# Patient Record
Sex: Male | Born: 1995 | Race: Black or African American | Hispanic: No | Marital: Single
Health system: Southern US, Community
[De-identification: ages and names within clinical notes are randomized; demographics above are authoritative.]

## PROBLEM LIST (undated history)

## (undated) VITALS — BP 95/62 | HR 92 | Temp 98.2°F | Resp 18 | Ht <= 58 in | Wt 99.2 lb

## (undated) DIAGNOSIS — K5900 Constipation, unspecified: Secondary | ICD-10-CM

## (undated) DIAGNOSIS — R4689 Other symptoms and signs involving appearance and behavior: Secondary | ICD-10-CM

## (undated) DIAGNOSIS — Z68.41 Body mass index (BMI) pediatric, 85th percentile to less than 95th percentile for age: Secondary | ICD-10-CM

## (undated) DIAGNOSIS — J302 Other seasonal allergic rhinitis: Secondary | ICD-10-CM

## (undated) DIAGNOSIS — F419 Anxiety disorder, unspecified: Secondary | ICD-10-CM

## (undated) DIAGNOSIS — F913 Oppositional defiant disorder: Secondary | ICD-10-CM

## (undated) DIAGNOSIS — E663 Overweight: Secondary | ICD-10-CM

## (undated) DIAGNOSIS — F79 Unspecified intellectual disabilities: Secondary | ICD-10-CM

## (undated) DIAGNOSIS — F909 Attention-deficit hyperactivity disorder, unspecified type: Secondary | ICD-10-CM

## (undated) DIAGNOSIS — F4310 Post-traumatic stress disorder, unspecified: Secondary | ICD-10-CM

## (undated) HISTORY — DX: Body mass index (bmi) pediatric, 85th percentile to less than 95th percentile for age: Z68.53

## (undated) HISTORY — DX: Other seasonal allergic rhinitis: J30.2

## (undated) HISTORY — DX: Unspecified intellectual disabilities: F79

## (undated) HISTORY — DX: Overweight: E66.3

## (undated) HISTORY — PX: TYMPANOSTOMY TUBE PLACEMENT: SHX32

---
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
## 2018-03-27 IMAGING — DX DG HAND COMPLETE 3+V*R*
4 series · 4 of 4 positions shown · non-contrast
Comparison: None.

CLINICAL DATA: Status post fall onto hand, with right hand pain.
Initial encounter.

EXAM:
RIGHT HAND - COMPLETE 3+ VIEW

[hand pa]
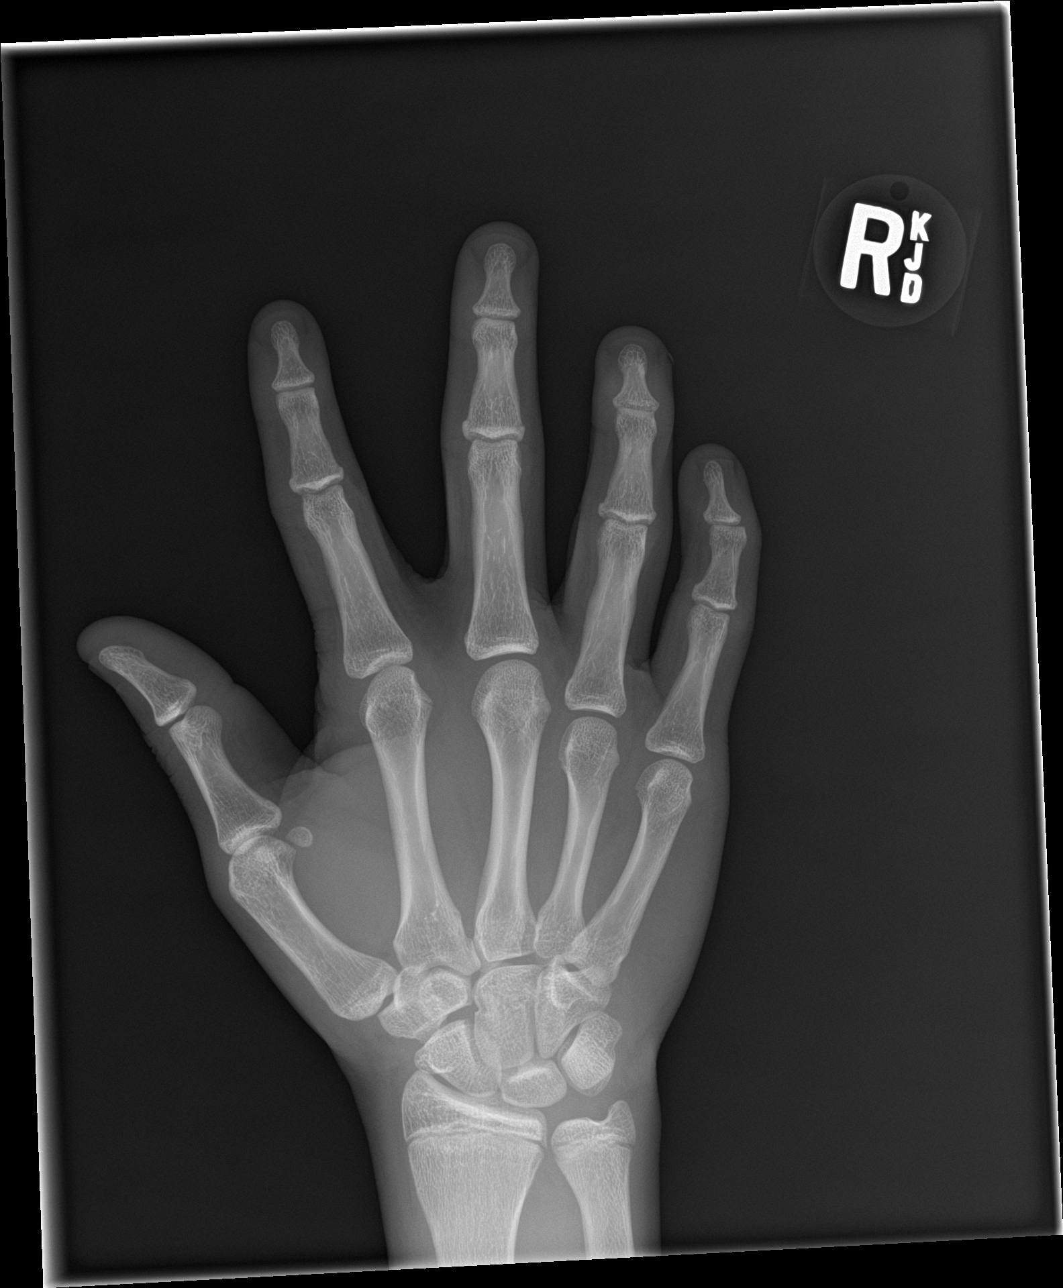

[hand obl (1 of 2)]
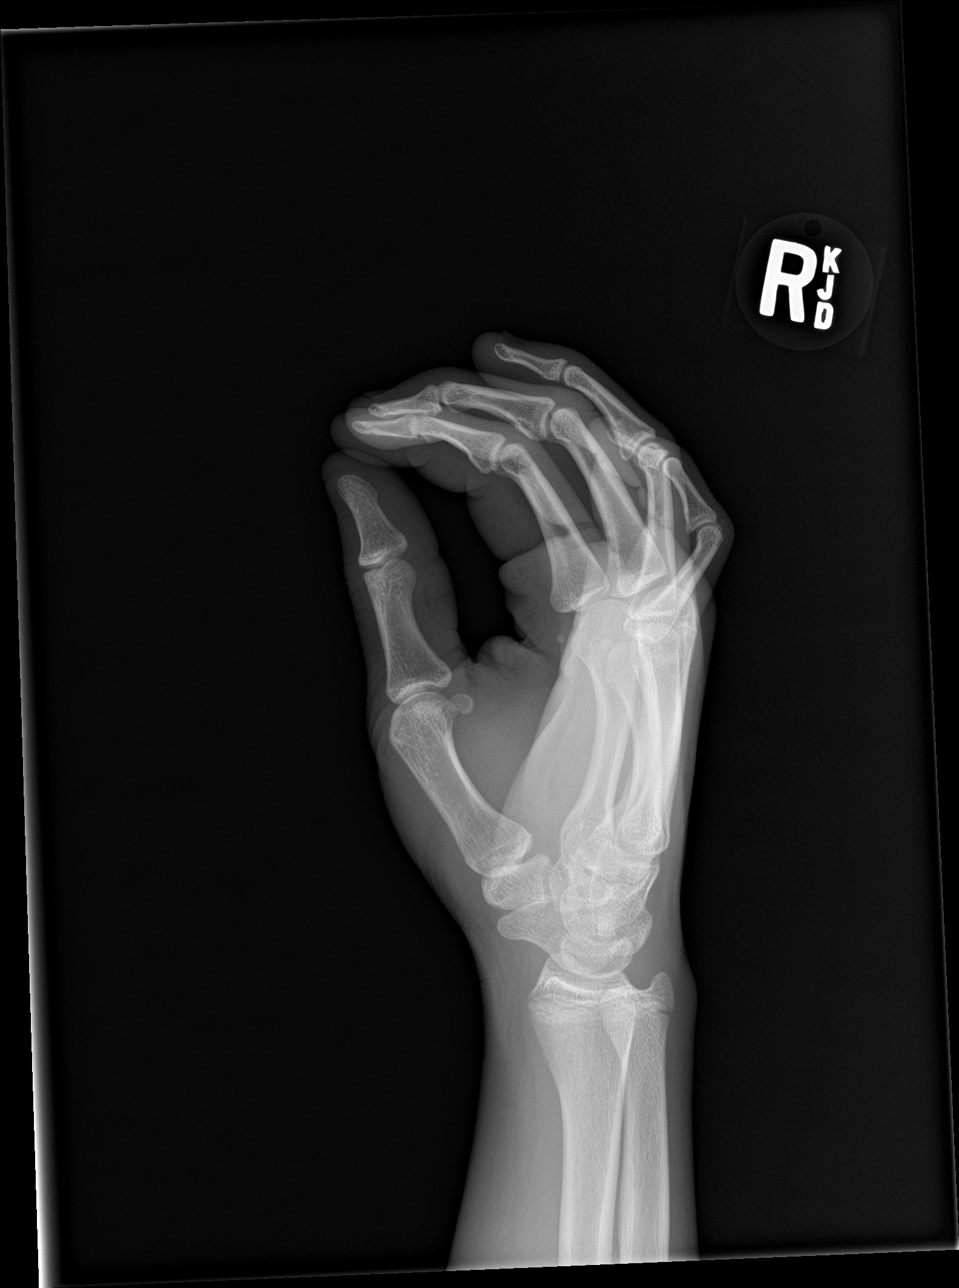

[hand lat]
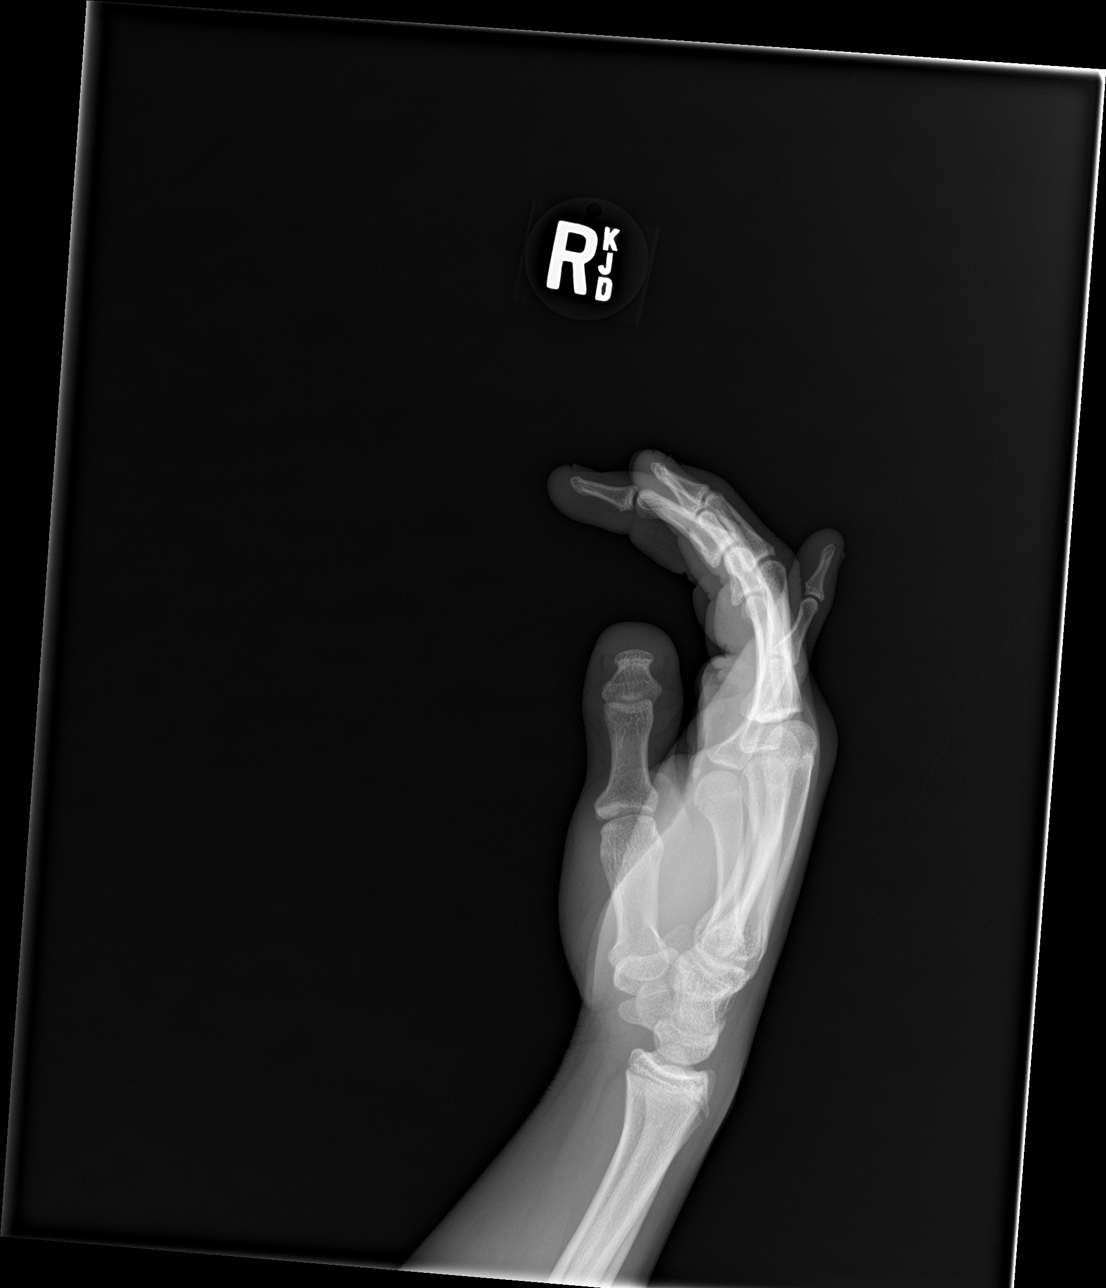

[hand obl (2 of 2)]
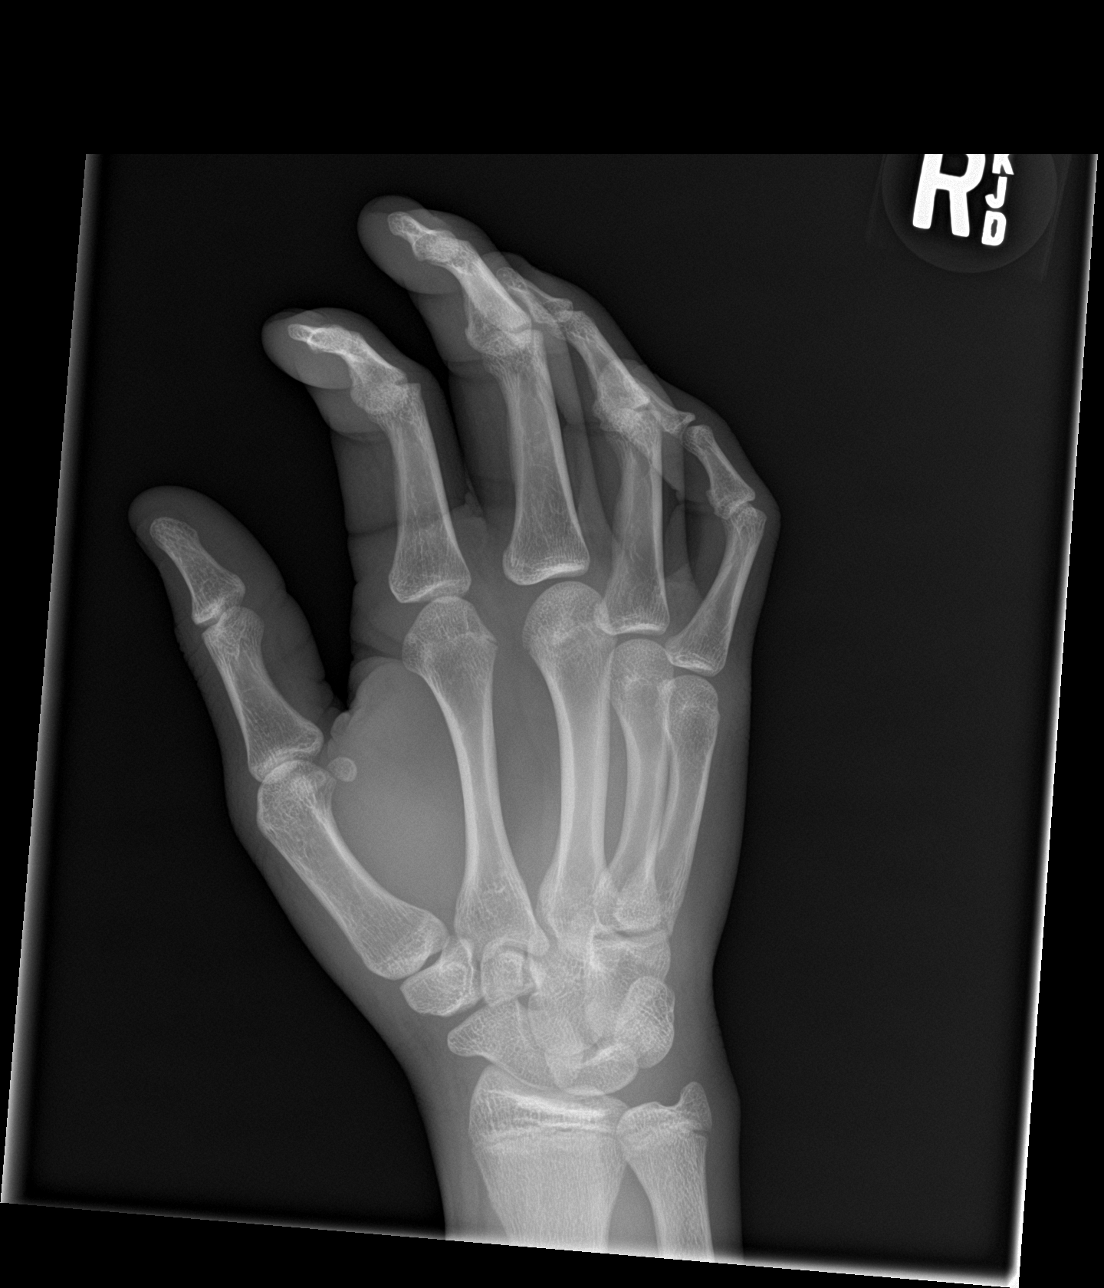

[4 of 4 positions shown; findings below may reference images not displayed]

FINDINGS: There is no evidence of fracture or dislocation. Visualized physes
are within normal limits. The joint spaces are preserved. The carpal
rows are intact, and demonstrate normal alignment. The soft tissues
are unremarkable in appearance.
IMPRESSION: No evidence of fracture or dislocation.

## 2020-01-26 ENCOUNTER — Ambulatory Visit: Payer: BC Managed Care – PPO | Attending: Internal Medicine

## 2020-01-26 DIAGNOSIS — Z23 Encounter for immunization: Secondary | ICD-10-CM

## 2020-01-26 NOTE — Progress Notes (Signed)
   Covid-19 Vaccination Clinic  Name:  Kerry Wilkinson    MRN: [id number] DOB: 1996/07/03  01/26/2020  [name] was observed post Covid-19 immunization for 15 minutes without incident. He was provided with Vaccine Information Sheet and instruction to access the V-Safe system.   [name] was instructed to call 911 with any severe reactions post vaccine: Marland Kitchen Difficulty breathing  . Swelling of face and throat  . A fast heartbeat  . A bad rash all over body  . Dizziness and weakness   Immunizations Administered    Name Date Dose VIS Date Route   Pfizer COVID-19 Vaccine 01/26/2020  2:10 PM 0.3 mL 10/13/2019 Intramuscular   Manufacturer: [employer], [employer]   Lot: [id number]   NDC: [address]-2

## 2020-02-12 ENCOUNTER — Ambulatory Visit: Payer: Medicaid Other | Admitting: Podiatry

## 2020-02-20 ENCOUNTER — Ambulatory Visit: Payer: BC Managed Care – PPO | Attending: Internal Medicine

## 2020-02-20 DIAGNOSIS — Z23 Encounter for immunization: Secondary | ICD-10-CM

## 2020-02-20 NOTE — Progress Notes (Signed)
   Covid-19 Vaccination Clinic  Name:  Kerry Wilkinson    MRN: [id number] DOB: 1995-11-20  02/20/2020  [name] was observed post Covid-19 immunization for 30 minutes based on pre-vaccination screening without incident. He was provided with Vaccine Information Sheet and instruction to access the V-Safe system.   Mr. Morden was instructed to call 911 with any severe reactions post vaccine: Marland Kitchen Difficulty breathing  . Swelling of face and throat  . A fast heartbeat  . A bad rash all over body  . Dizziness and weakness   Immunizations Administered    Name Date Dose VIS Date Route   Pfizer COVID-19 Vaccine 02/20/2020  9:33 AM 0.3 mL 12/27/2018 Intramuscular   Manufacturer: [employer], [employer]   Lot: [id number]   NDC: [address]-9

## 2020-06-25 ENCOUNTER — Encounter (HOSPITAL_COMMUNITY): Payer: Self-pay

## 2020-06-25 ENCOUNTER — Emergency Department (HOSPITAL_COMMUNITY): Payer: Medicaid Other

## 2020-06-25 ENCOUNTER — Other Ambulatory Visit: Payer: Self-pay

## 2020-06-25 DIAGNOSIS — Y93E9 Activity, other interior property and clothing maintenance: Secondary | ICD-10-CM | POA: Diagnosis not present

## 2020-06-25 DIAGNOSIS — Y999 Unspecified external cause status: Secondary | ICD-10-CM | POA: Insufficient documentation

## 2020-06-25 DIAGNOSIS — Y9289 Other specified places as the place of occurrence of the external cause: Secondary | ICD-10-CM | POA: Diagnosis not present

## 2020-06-25 DIAGNOSIS — S60011A Contusion of right thumb without damage to nail, initial encounter: Secondary | ICD-10-CM | POA: Insufficient documentation

## 2020-06-25 DIAGNOSIS — W230XXA Caught, crushed, jammed, or pinched between moving objects, initial encounter: Secondary | ICD-10-CM | POA: Insufficient documentation

## 2020-06-25 DIAGNOSIS — S6991XA Unspecified injury of right wrist, hand and finger(s), initial encounter: Secondary | ICD-10-CM | POA: Diagnosis present

## 2020-06-25 NOTE — ED Triage Notes (Signed)
Pt states his finger tip was bleeding at first and he doesn't have any feeling

## 2020-06-26 ENCOUNTER — Emergency Department (HOSPITAL_COMMUNITY)
Admission: EM | Admit: 2020-06-26 | Discharge: 2020-06-26 | Disposition: A | Discharge status: Home / Self Care | Payer: Medicaid Other | Attending: Emergency Medicine | Admitting: Emergency Medicine

## 2020-06-26 DIAGNOSIS — S60011A Contusion of right thumb without damage to nail, initial encounter: Secondary | ICD-10-CM

## 2020-06-26 NOTE — Discharge Instructions (Signed)
Tylenol or motrin for pain. Wear splint for now until finger is feeling better. Follow-up with primary care as needed. Return here for new concerns.

## 2020-06-26 NOTE — ED Provider Notes (Signed)
[facility]-EMERGENCY DEPT Provider Note   CSN: [id number] Arrival date & time: 06/25/20  2231     History Chief Complaint  Patient presents with  . Finger Injury    Kerry Wilkinson is a 24 y.o. male.  The history is provided by the patient and medical records.    24 year old male with history of ADHD, anxiety, intellectual disability, ODD, PTSD, seasonal allergies, presenting to the ED with right injury.  He was taking out the trash at his group home when the dumpster lid slammed down on his right thumb.  Initially he reported he could not feel his thumb and so they were concerned.  Since waiting here in the lobby he states thumb now has sensation again and just feels "sore".  He is right-hand dominant.  No intervention tried prior to arrival.  Past Medical History:  Diagnosis Date  . ADHD (attention deficit hyperactivity disorder)   . Anxiety   . Constipation   . Intellectual disability    IQ reported in [facility] notes at 54  . ODD (oppositional defiant disorder)   . Outbursts of explosive behavior   . Overweight peds (BMI 85-94.9 percentile) 12/22/2013  . PTSD (post-traumatic stress disorder)   . Seasonal allergic rhinitis     Patient Active Problem List   Diagnosis Date Noted  . Aggressive behavior   . Homicidal ideation   . Suicidal ideations   . 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 Overweight peds (BMI 85-94.9 percentile) 12/22/2013  . Intellectual disability   . Constipation   . Outbursts of explosive behavior   . Seasonal allergic rhinitis   . Attention deficit hyperactivity disorder, combined type 02/01/2012  . PTSD (post-traumatic stress disorder) 01/28/2012  . ODD (oppositional defiant disorder) 01/28/2012  . Reactive attachment disorder 01/28/2012    Past Surgical History:  Procedure Laterality Date  . TYMPANOSTOMY TUBE PLACEMENT         Family History  Adopted: Yes     Social History   Tobacco Use  . Smoking status: Passive Smoke Exposure - Never Smoker  . Smokeless tobacco: Never Used  Substance Use Topics  . Alcohol use: No  . Drug use: No    Home Medications Prior to Admission medications   Medication Sig Start Date End Date Taking? Authorizing Provider  amphetamine-dextroamphetamine (ADDERALL) 20 MG tablet Take 20 mg by mouth 2 (two) times Idrovo.     [provider]  buPROPion (WELLBUTRIN SR) 100 MG 12 hr tablet Take 100 mg by mouth 2 (two) times Manring.    [provider]  clonazePAM (KLONOPIN) 0.5 MG tablet Take 0.5 mg by mouth 2 (two) times Toscano.    [provider]  cloNIDine HCl (KAPVAY) 0.1 MG TB12 ER tablet Take 2 tablets (0.2 mg total) by mouth 2 (two) times Gartley. 09/02/14   Chauncey Mann, MD  ibuprofen (ADVIL,MOTRIN) 600 MG tablet Take 1 tablet (600 mg total) by mouth every 6 (six) hours as needed for moderate pain. Patient not taking: Reported on 11/07/2016 10/07/15   Lowanda Foster, NP  lamoTRIgine (LAMICTAL) 25 MG tablet Take 2 tablets (50 mg total) by mouth 2 (two) times Ruddock. Patient taking differently: Take 100 mg by mouth 2 (two) times Sapp.  09/02/14   Leata Mouse, MD  montelukast (SINGULAIR) 10 MG tablet Take 10 mg by mouth Whittier.    [provider]  polyethylene glycol (MIRALAX / GLYCOLAX) packet Take 17 g  by mouth Kovacevic as needed for mild constipation.    [provider]  prazosin (MINIPRESS) 1 MG capsule Take 1 mg by mouth at bedtime.    [provider]  QUEtiapine (SEROQUEL) 200 MG tablet Take 1 tablet (200 mg total) by mouth 3 (three) times Wehrman with meals. 09/02/14   Leata Mouse, MD  traZODone (DESYREL) 100 MG tablet Take 1 tablet (100 mg total) by mouth at bedtime. 09/02/14   Chauncey Mann, MD    Allergies    Divalproex sodium, Geodon [ziprasidone hydrochloride], and Lactose intolerance (gi)  Review of Systems   Review of Systems   Musculoskeletal: Positive for arthralgias.  All other systems reviewed and are negative.   Physical Exam Updated Vital Signs BP (!) 129/96 (BP Location: Left Arm)   Pulse 90   Temp 98.6 F (37 C) (Oral)   Resp 18   SpO2 97%   Physical Exam Vitals and nursing note reviewed.  Constitutional:      Appearance: He is well-developed.  HENT:     Head: Normocephalic and atraumatic.  Eyes:     Conjunctiva/sclera: Conjunctivae normal.     Pupils: Pupils are equal, round, and reactive to light.  Cardiovascular:     Rate and Rhythm: Normal rate and regular rhythm.     Heart sounds: Normal heart sounds.  Pulmonary:     Effort: Pulmonary effort is normal.     Breath sounds: Normal breath sounds.  Abdominal:     General: Bowel sounds are normal.     Palpations: Abdomen is soft.  Musculoskeletal:        General: Normal range of motion.     Cervical back: Normal range of motion.     Comments: Right thumb normal in appearance aside from small area of bruising just proximal to the nail; no skin avulsion or laceration noted; nail remains intact; normal ROM of finger, good cap refill, normal distal sensation  Skin:    General: Skin is warm and dry.  Neurological:     Mental Status: He is alert and oriented to person, place, and time.     ED Results / Procedures / Treatments   Labs (all labs ordered are listed, but only abnormal results are displayed) Labs Reviewed - No data to display  EKG None  Radiology DG Finger Thumb Right  Result Date: 06/26/2020 CLINICAL DATA:  24 year old male with trauma to the right thumb. EXAM: RIGHT THUMB 2+V COMPARISON:  None. FINDINGS: There is no evidence of fracture or dislocation. There is no evidence of arthropathy or other focal bone abnormality. Soft tissues are unremarkable. IMPRESSION: Negative. Electronically Signed   By: Elgie Collard M.D.   On: 06/26/2020 00:05    Procedures .Ortho Injury Treatment  Date/Time: 06/26/2020 2:31  AM Performed by: Garlon Hatchet, PA-C Authorized by: Garlon Hatchet, PA-C   Consent:    Consent obtained:  Verbal   Consent given by:  Patient and guardian   Risks discussed:  Stiffness and nerve damage   Alternatives discussed:  No treatmentInjury location: finger Location details: right thumb Injury type: soft tissue Pre-procedure neurovascular assessment: neurovascularly intact  Anesthesia: Local anesthesia used: no  Patient sedated: NoImmobilization: splint Splint type: static finger Supplies used: aluminum splint Post-procedure neurovascular assessment: post-procedure neurovascularly intact Patient tolerance: patient tolerated the procedure well with no immediate complications    (including critical care time)  Medications Ordered in ED Medications - No data to display  ED Course  I have reviewed  the triage vital signs and the nursing notes.  Pertinent labs & imaging results that were available during my care of the patient were reviewed by me and considered in my medical decision making (see chart for details).    MDM Rules/Calculators/A&P  24 year old male here with right thumb injury after dumpster lid slammed down on his finger.  Does have small area of bruising just proximal to the nailbed.  Nail remains intact, no open wound or laceration.  Finger with normal range of motion and is neurovascularly intact.  X-ray is negative.  He was placed in static splint.  Can take Tylenol or Motrin for pain.  Follow-up with PCP.  Return here for any new or acute changes.  Final Clinical Impression(s) / ED Diagnoses Final diagnoses:  Contusion of right thumb without damage to nail, initial encounter    Rx / DC Orders ED Discharge Orders    None       Garlon Hatchet, PA-C 06/26/20 0234    Nira Conn, MD 06/26/20 [phone]

## 2021-09-10 ENCOUNTER — Other Ambulatory Visit: Payer: Self-pay

## 2021-09-10 ENCOUNTER — Encounter (HOSPITAL_COMMUNITY): Payer: Self-pay

## 2021-09-10 ENCOUNTER — Ambulatory Visit (HOSPITAL_COMMUNITY)
Admission: EM | Admit: 2021-09-10 | Discharge: 2021-09-10 | Disposition: A | Discharge status: Home / Self Care | Payer: Medicaid Other

## 2021-09-10 DIAGNOSIS — S299XXA Unspecified injury of thorax, initial encounter: Secondary | ICD-10-CM | POA: Diagnosis not present
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
## 2022-03-17 IMAGING — CR DG FINGER THUMB 2+V*R*
3 series · 3 of 3 positions shown · non-contrast
Comparison: None.

CLINICAL DATA: 23-year-old male with trauma to the right thumb.

EXAM:
RIGHT THUMB 2+V

[x finger pa right]
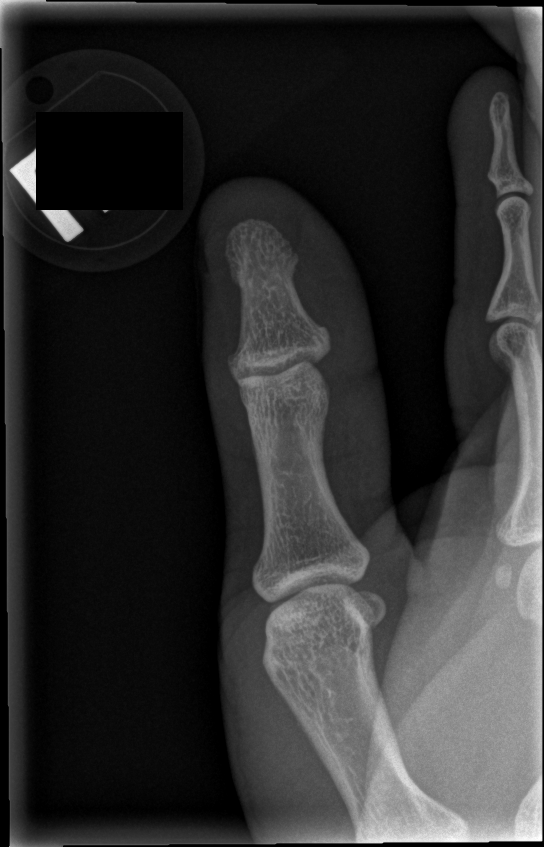

[x finger obl right]
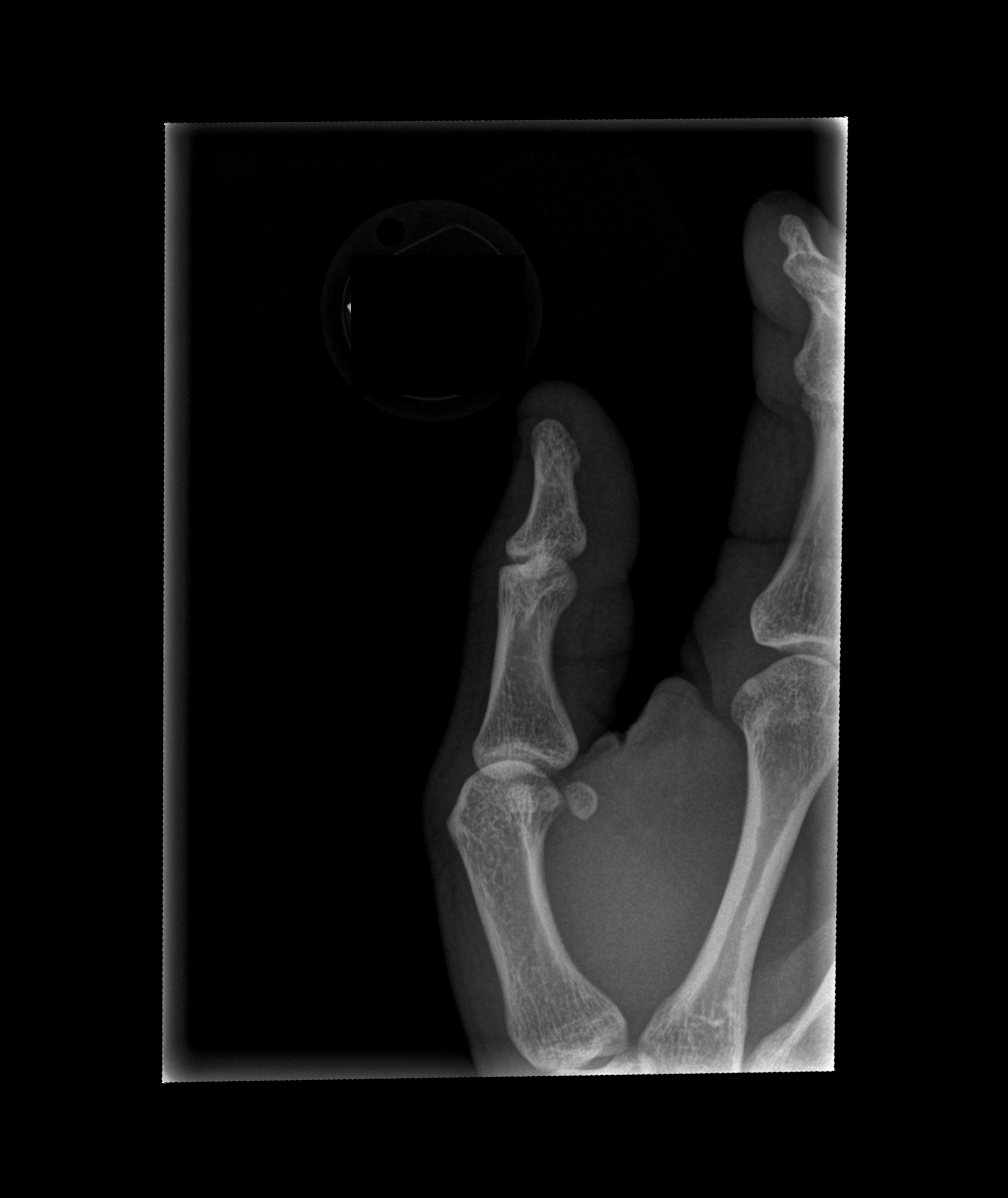

[x finger lat right]
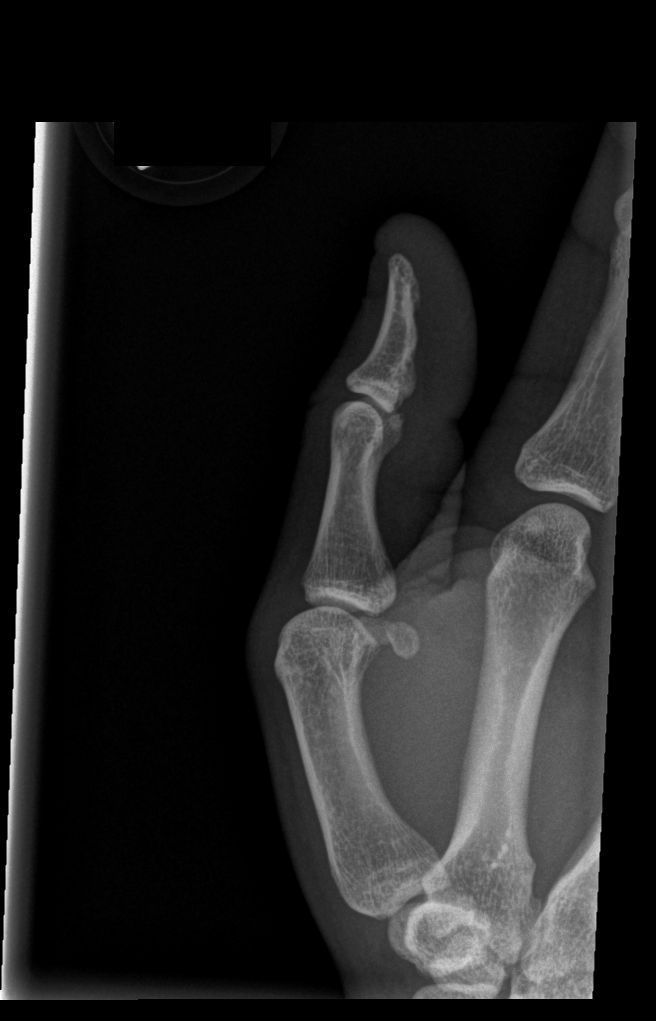

[3 of 3 positions shown; findings below may reference images not displayed]

FINDINGS: There is no evidence of fracture or dislocation. There is no
evidence of arthropathy or other focal bone abnormality. Soft
tissues are unremarkable.
IMPRESSION: Negative.
